# Patient Record
Sex: Male | Born: 1980 | Race: Black or African American | Hispanic: No | Marital: Single | Smoking: Current some day smoker
Health system: Southern US, Community
[De-identification: ages and names within clinical notes are randomized; demographics above are authoritative.]

## PROBLEM LIST (undated history)

## (undated) DIAGNOSIS — I10 Essential (primary) hypertension: Secondary | ICD-10-CM

## (undated) DIAGNOSIS — E785 Hyperlipidemia, unspecified: Secondary | ICD-10-CM

---

## 2020-08-07 ENCOUNTER — Other Ambulatory Visit: Payer: Self-pay

## 2020-08-07 ENCOUNTER — Emergency Department (HOSPITAL_COMMUNITY)
Admission: EM | Admit: 2020-08-07 | Discharge: 2020-08-08 | Disposition: A | Discharge status: Home / Self Care | Payer: 59 | Attending: Emergency Medicine | Admitting: Emergency Medicine

## 2020-08-07 ENCOUNTER — Encounter (HOSPITAL_COMMUNITY): Payer: Self-pay | Admitting: Emergency Medicine

## 2020-08-07 DIAGNOSIS — R748 Abnormal levels of other serum enzymes: Secondary | ICD-10-CM | POA: Diagnosis not present

## 2020-08-07 DIAGNOSIS — R1011 Right upper quadrant pain: Secondary | ICD-10-CM | POA: Insufficient documentation

## 2020-08-07 DIAGNOSIS — I10 Essential (primary) hypertension: Secondary | ICD-10-CM | POA: Insufficient documentation

## 2020-08-07 DIAGNOSIS — R1010 Upper abdominal pain, unspecified: Secondary | ICD-10-CM

## 2020-08-07 HISTORY — DX: Hyperlipidemia, unspecified: E78.5

## 2020-08-07 HISTORY — DX: Essential (primary) hypertension: I10

## 2020-08-07 NOTE — ED Triage Notes (Signed)
Pt reports cramping under right ribs that radiates to right side x 2 weeks, worsening over last week.  Denies fevers, N/V, changes in Bms.

## 2020-08-08 ENCOUNTER — Emergency Department (HOSPITAL_COMMUNITY): Payer: 59

## 2020-08-08 ENCOUNTER — Encounter (HOSPITAL_COMMUNITY): Payer: Self-pay

## 2020-08-08 LAB — URINALYSIS, ROUTINE W REFLEX MICROSCOPIC
Bilirubin Urine: NEGATIVE
Glucose, UA: NEGATIVE mg/dL
Hgb urine dipstick: NEGATIVE
Ketones, ur: 5 mg/dL — AB
Leukocytes,Ua: NEGATIVE
Nitrite: NEGATIVE
Protein, ur: NEGATIVE mg/dL
Specific Gravity, Urine: 1.025 (ref 1.005–1.030)
pH: 5 (ref 5.0–8.0)

## 2020-08-08 LAB — CBC WITH DIFFERENTIAL/PLATELET
Abs Immature Granulocytes: 0.03 10*3/uL (ref 0.00–0.07)
Basophils Absolute: 0 10*3/uL (ref 0.0–0.1)
Basophils Relative: 0 %
Eosinophils Absolute: 0.2 10*3/uL (ref 0.0–0.5)
Eosinophils Relative: 2 %
HCT: 41.5 % (ref 39.0–52.0)
Hemoglobin: 13.4 g/dL (ref 13.0–17.0)
Immature Granulocytes: 0 %
Lymphocytes Relative: 48 %
Lymphs Abs: 4.4 10*3/uL — ABNORMAL HIGH (ref 0.7–4.0)
MCH: 29.3 pg (ref 26.0–34.0)
MCHC: 32.3 g/dL (ref 30.0–36.0)
MCV: 90.8 fL (ref 80.0–100.0)
Monocytes Absolute: 0.7 10*3/uL (ref 0.1–1.0)
Monocytes Relative: 8 %
Neutro Abs: 3.9 10*3/uL (ref 1.7–7.7)
Neutrophils Relative %: 42 %
Platelets: 281 10*3/uL (ref 150–400)
RBC: 4.57 MIL/uL (ref 4.22–5.81)
RDW: 14 % (ref 11.5–15.5)
WBC: 9.3 10*3/uL (ref 4.0–10.5)
nRBC: 0 % (ref 0.0–0.2)

## 2020-08-08 LAB — COMPREHENSIVE METABOLIC PANEL
ALT: 21 U/L (ref 0–44)
AST: 33 U/L (ref 15–41)
Albumin: 3.8 g/dL (ref 3.5–5.0)
Alkaline Phosphatase: 73 U/L (ref 38–126)
Anion gap: 8 (ref 5–15)
BUN: 17 mg/dL (ref 6–20)
CO2: 25 mmol/L (ref 22–32)
Calcium: 9.3 mg/dL (ref 8.9–10.3)
Chloride: 105 mmol/L (ref 98–111)
Creatinine, Ser: 0.93 mg/dL (ref 0.61–1.24)
GFR, Estimated: >60 mL/min (ref >60)
Glucose, Bld: 155 mg/dL — ABNORMAL HIGH (ref 70–99)
Potassium: 4.2 mmol/L (ref 3.5–5.1)
Sodium: 138 mmol/L (ref 135–145)
Total Bilirubin: 0.2 mg/dL — ABNORMAL LOW (ref 0.3–1.2)
Total Protein: 7.1 g/dL (ref 6.5–8.1)

## 2020-08-08 LAB — LIPASE, BLOOD: Lipase: 176 U/L — ABNORMAL HIGH (ref 11–51)

## 2020-08-08 MED ORDER — HYDROCODONE-ACETAMINOPHEN 5-325 MG PO TABS: 1.0000 | ORAL_TABLET | ORAL | 0 refills | Status: DC | PRN

## 2020-08-08 MED ORDER — IOHEXOL 350 MG/ML SOLN
80.0000 mL | Freq: Once | INTRAVENOUS | Status: AC | PRN
  Administered 2020-08-08: 80 mL via INTRAVENOUS

## 2020-08-08 MED ORDER — OXYCODONE-ACETAMINOPHEN 5-325 MG PO TABS
1.0000 | ORAL_TABLET | Freq: Once | ORAL | Status: AC
  Administered 2020-08-08: 1 via ORAL
  Filled 2020-08-08: qty 1

## 2020-08-08 NOTE — Discharge Instructions (Addendum)
Follow up with primary care for further outpatient evaluation of abdominal pain. Return to the ED with any high fever, severe pain, bloody stools, excessive vomiting or other new concern.

## 2020-08-08 NOTE — ED Notes (Signed)
Patient transported to CT 

## 2020-08-08 NOTE — ED Provider Notes (Signed)
[facility]-EMERGENCY DEPT Provider Note   CSN: [id number] Arrival date & time: 08/07/20  2325     History Chief Complaint  Patient presents with   Abdominal Pain    Shaune Westfall is a 40 y.o. male.  Patient to ED with RUQ abdominal pain for the past 6 weeks that has been intermittent until 6 days ago when it became more constant. No nausea, vomiting. He denies modifying factors, including no worse with eating. No fever. He has had infrequent loose stool that are nonbloody. No cough or SOB. The pain will radiate down the right side of the abdomen but no where else. No urinary symptoms.   The history is provided by the patient. No language interpreter was used.  Abdominal Pain Associated symptoms: no chest pain, no chills, no constipation, no cough, no fever, no nausea, no shortness of breath and no vomiting       Past Medical History:  Diagnosis Date   Hyperlipidemia    Hypertension     There are no problems to display for this patient.   History reviewed. No pertinent surgical history.     No family history on file.     Home Medications Prior to Admission medications   Not on File    Allergies    Patient has no known allergies.  Review of Systems   Review of Systems  Constitutional:  Negative for chills and fever.  HENT: Negative.    Respiratory: Negative.  Negative for cough and shortness of breath.   Cardiovascular: Negative.  Negative for chest pain.  Gastrointestinal:  Positive for abdominal pain. Negative for blood in stool, constipation, nausea and vomiting.  Musculoskeletal: Negative.   Skin: Negative.   Neurological: Negative.    Physical Exam Updated Vital Signs BP (!) 145/93 (BP Location: Left Arm)   Pulse 70   Temp 97.8 F (36.6 C) (Oral)   Resp 12   Ht 5\' 6"  (1.676 m)   Wt 86.2 kg   SpO2 99%   BMI 30.67 kg/m   Physical Exam Vitals and nursing note reviewed.  Constitutional:      Appearance: He is  well-developed.  HENT:     Head: Normocephalic.  Cardiovascular:     Rate and Rhythm: Normal rate and regular rhythm.     Heart sounds: No murmur heard. Pulmonary:     Effort: Pulmonary effort is normal.     Breath sounds: Normal breath sounds. No wheezing, rhonchi or rales.  Abdominal:     General: Abdomen is protuberant. Bowel sounds are normal.     Palpations: Abdomen is soft.     Tenderness: There is abdominal tenderness in the right upper quadrant. There is no guarding or rebound.  Musculoskeletal:        General: Normal range of motion.     Cervical back: Normal range of motion and neck supple.  Skin:    General: Skin is warm and dry.  Neurological:     General: No focal deficit present.     Mental Status: He is alert and oriented to person, place, and time.    ED Results / Procedures / Treatments   Labs (all labs ordered are listed, but only abnormal results are displayed) Labs Reviewed  LIPASE, BLOOD - Abnormal; Notable for the following components:      Result Value   Lipase 176 (*)    All other components within normal limits  COMPREHENSIVE METABOLIC PANEL - Abnormal; Notable for the following components:  Glucose, Bld 155 (*)    Total Bilirubin 0.2 (*)    All other components within normal limits  URINALYSIS, ROUTINE W REFLEX MICROSCOPIC - Abnormal; Notable for the following components:   APPearance HAZY (*)    Ketones, ur 5 (*)    All other components within normal limits  CBC WITH DIFFERENTIAL/PLATELET - Abnormal; Notable for the following components:   Lymphs Abs 4.4 (*)    All other components within normal limits    EKG None  Radiology No results found.  Procedures Procedures   Medications Ordered in ED Medications - No data to display  ED Course  I have reviewed the triage vital signs and the nursing notes.  Pertinent labs & imaging results that were available during my care of the patient were reviewed by me and considered in my medical  decision making (see chart for details).    MDM Rules/Calculators/A&P                           Patient to ED with complaint of abdominal pain as described in the HPI. No fever. No N, V.   There is right UQ tenderness to distended abdomen. No previous surgeries. No fever. Lipase elevated to 176. RUQ [place] ordered and is negative. On re-exam, the patient is still very tender, now across upper abdomen. Will obtain CT.   CT abd/pel unremarkable. Patient and family updated. No serious condition identified. He can be discharged home with referral to primary care.   Final Clinical Impression(s) / ED Diagnoses Final diagnoses:  None   Abdominal pain Elevated lipase  Rx / DC Orders ED Discharge Orders     None        Elpidio Anis, PA-C 08/08/20 9470    Gilda Crease, MD 08/08/20 [phone]

## 2022-05-19 IMAGING — CT CT ABD-PELV W/ CM
2 of 5 series · 16 of 46 positions shown, 18 images · IV contrast (omnipaque)
Comparison: None.

CLINICAL DATA: Abdominal abscess or infection suspected. Cramping
under the right ribs for 2 weeks

EXAM:
CT ABDOMEN AND PELVIS WITH CONTRAST
TECHNIQUE: Multidetector CT imaging of the abdomen and pelvis was performed
using the standard protocol following bolus administration of
intravenous contrast.
CONTRAST:  80mL OMNIPAQUE IOHEXOL 350 MG/ML SOLN

[Series 2: axial st · axial · 0.82mm/px · z∈[-504,-124]mm · 13 of 90 slices shown, 15 images]
[im 7/90  soft-tissue]
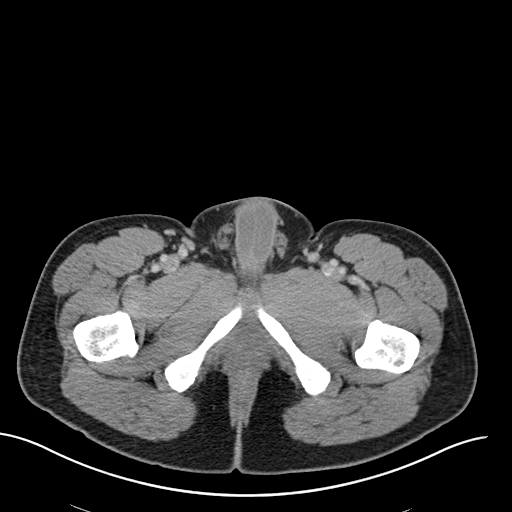
[im 7/90  bone]
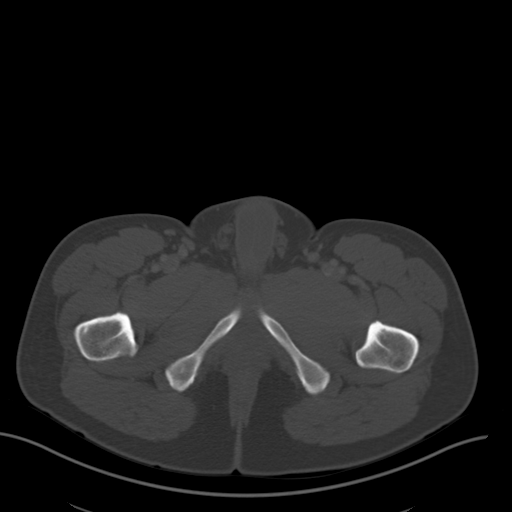
[im 13/90  soft-tissue]
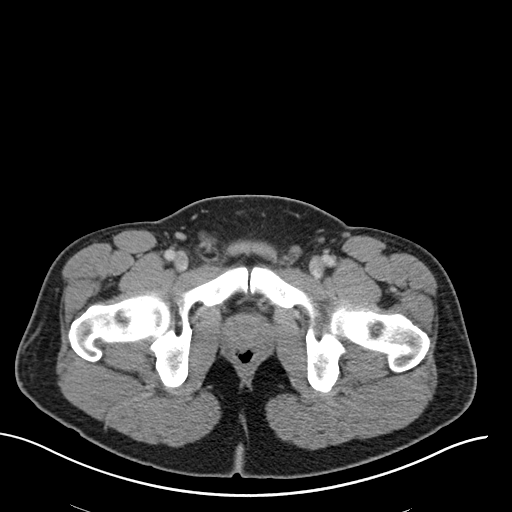
[im 20/90  soft-tissue]
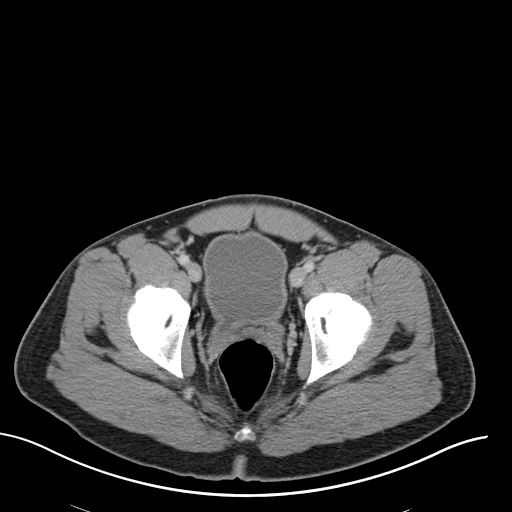
[im 26/90  soft-tissue]
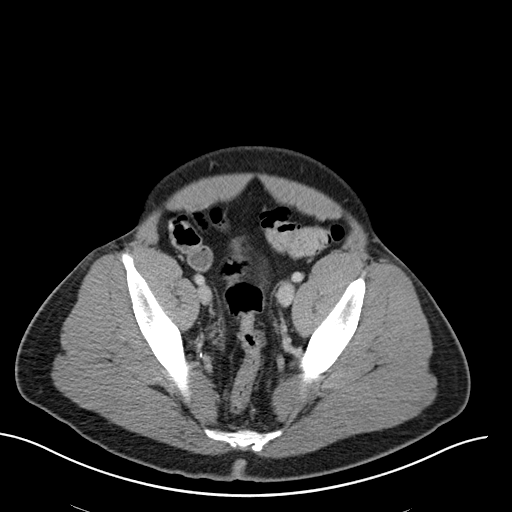
[im 32/90  soft-tissue]
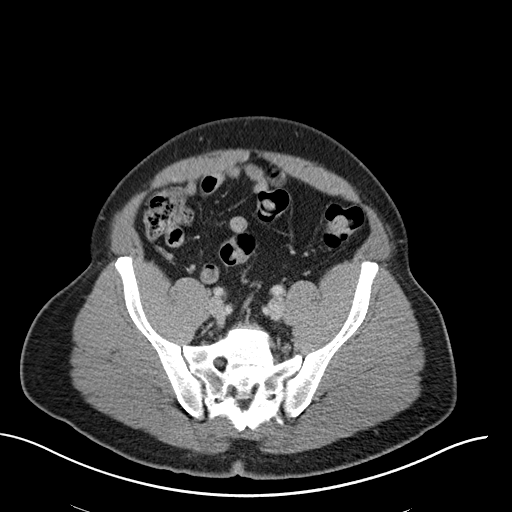
[im 39/90  soft-tissue]
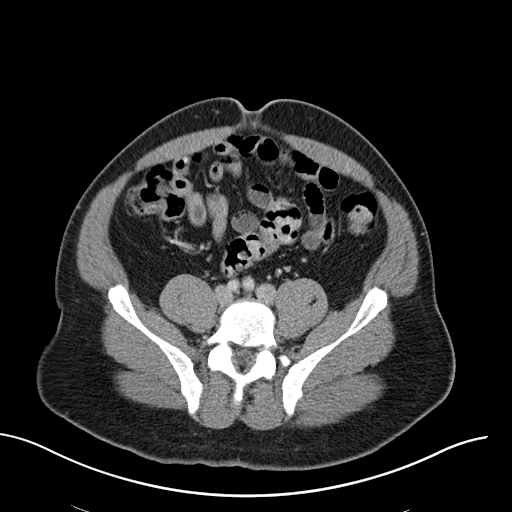
[im 45/90  soft-tissue]
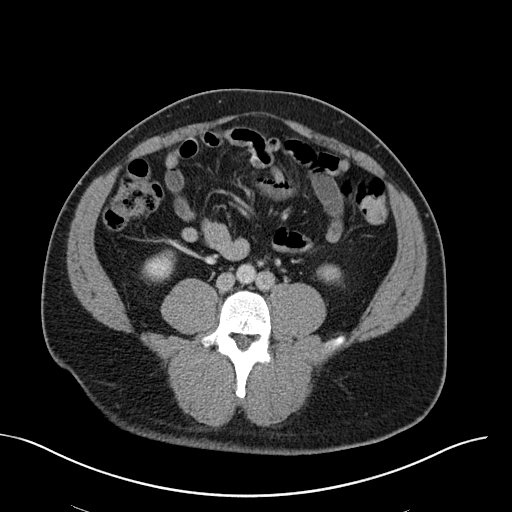
[im 51/90  soft-tissue]
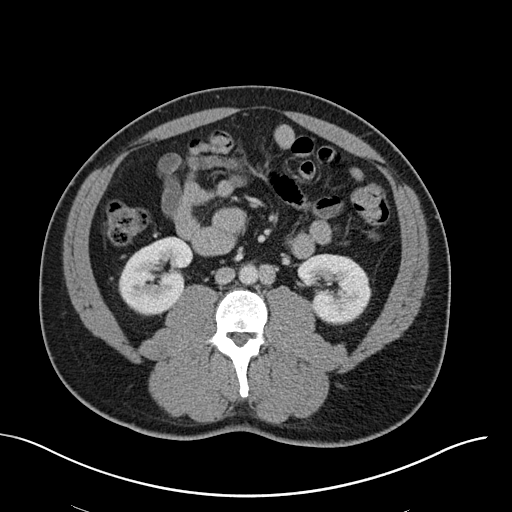
[im 58/90  soft-tissue]
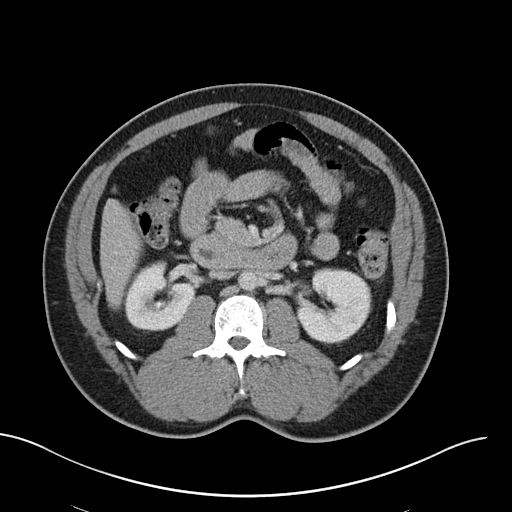
[im 58/90  bone]
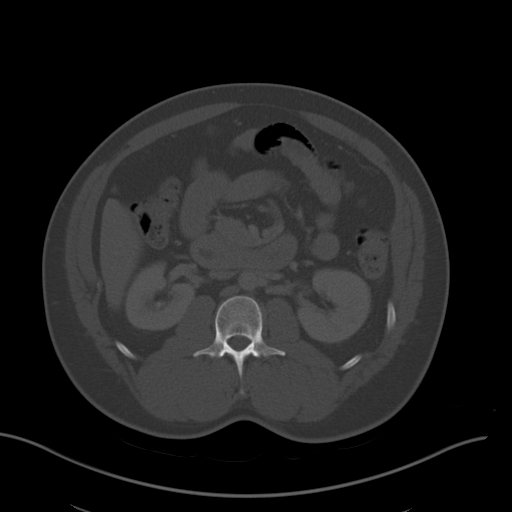
[im 64/90  soft-tissue]
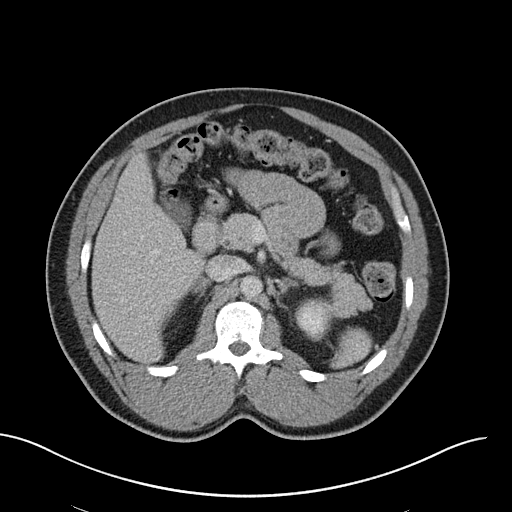
[im 70/90  soft-tissue]
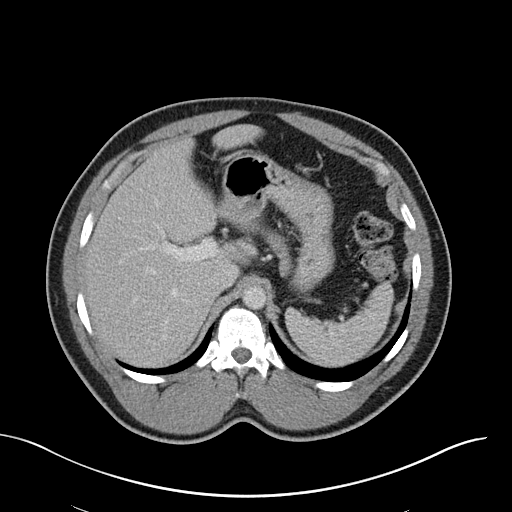
[im 77/90  soft-tissue]
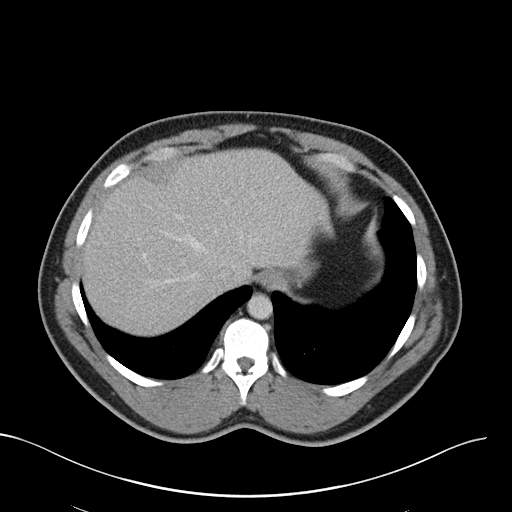
[im 83/90  soft-tissue]
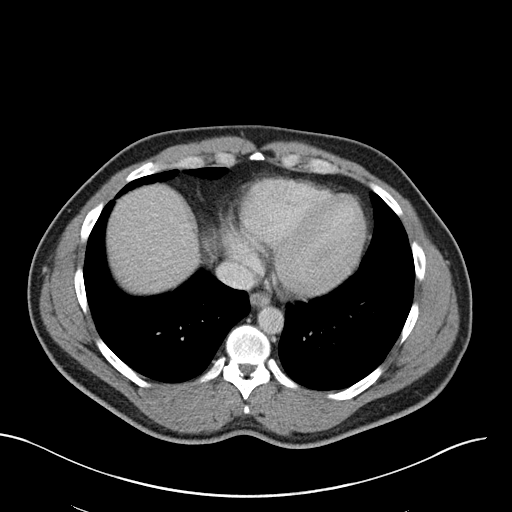

[Series 5: coronal st · coronal · 0.77mm/px · 3 of 171 slices shown]
[im 57/171  soft-tissue]
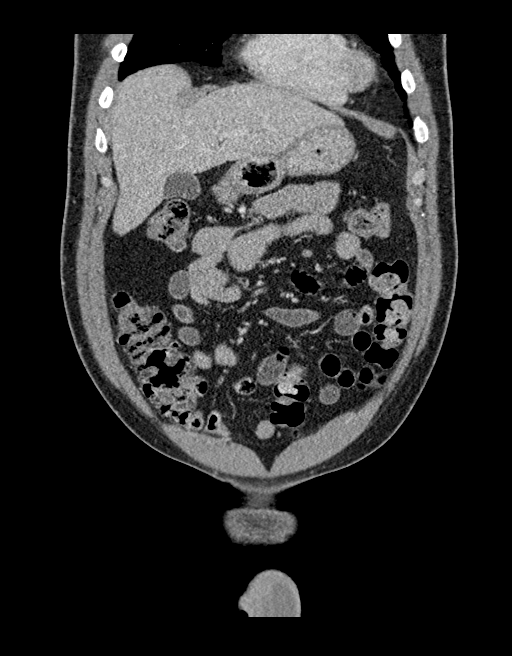
[im 76/171  soft-tissue]
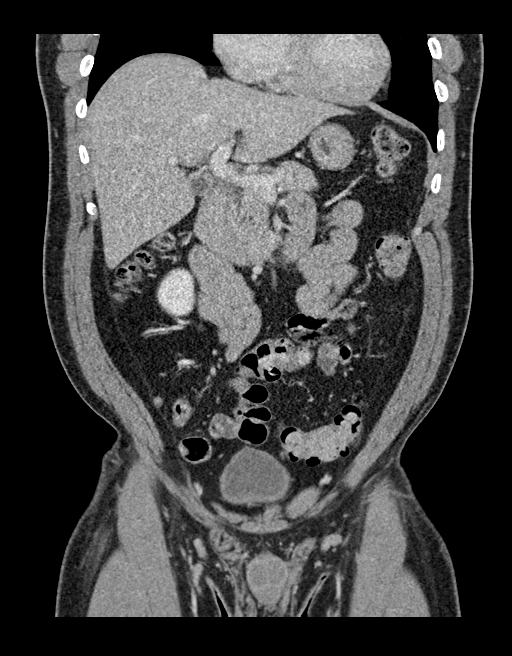
[im 95/171  soft-tissue]
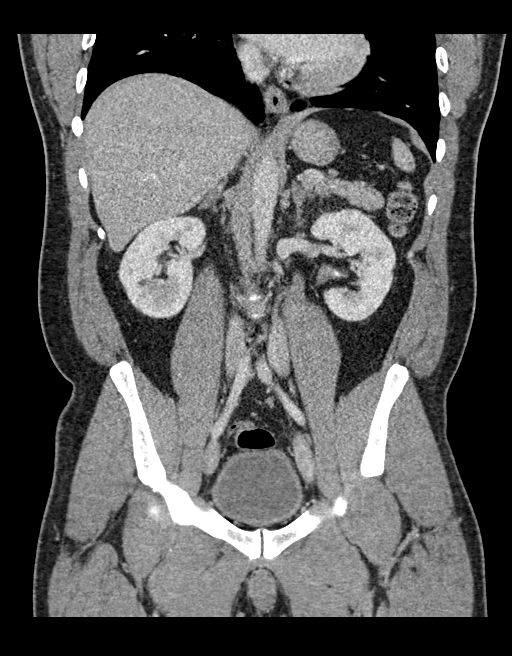

[16 of 46 positions shown; findings below may reference images not displayed]

FINDINGS: Lower chest:  No contributory findings.

Hepatobiliary: No focal liver abnormality.No evidence of biliary
obstruction or stone.

Pancreas: Unremarkable.

Spleen: Unremarkable.

Adrenals/Urinary Tract: Negative adrenals. No hydronephrosis or
ureteral stone. Limited for detecting small renal calculi given
early contrast excretion. Diverticulum without filling defect
extending posteriorly and laterally from the bladder base, near the
ureteral orifice.

Stomach/Bowel:  No obstruction. No appendicitis.

Vascular/Lymphatic: No acute vascular abnormality. Duplicated IVC.
No mass or adenopathy.

Reproductive:No pathologic findings.

Other: No ascites or pneumoperitoneum.

Musculoskeletal: No acute abnormalities. AVN of the right femoral
head without collapse.
IMPRESSION: 1. No acute finding.
2. Right-sided periureteric bladder diverticulum, possibly
congenital.
3. Avascular necrosis of the right femoral head.

## 2023-04-15 ENCOUNTER — Ambulatory Visit (HOSPITAL_COMMUNITY): Admission: EM | Admit: 2023-04-15 | Discharge: 2023-04-15 | Disposition: A | Discharge status: Home / Self Care

## 2023-04-15 ENCOUNTER — Ambulatory Visit (INDEPENDENT_AMBULATORY_CARE_PROVIDER_SITE_OTHER)

## 2023-04-15 ENCOUNTER — Encounter (HOSPITAL_COMMUNITY): Payer: Self-pay

## 2023-04-15 DIAGNOSIS — R6884 Jaw pain: Secondary | ICD-10-CM

## 2023-04-15 DIAGNOSIS — M7989 Other specified soft tissue disorders: Secondary | ICD-10-CM | POA: Diagnosis not present

## 2023-04-15 DIAGNOSIS — M79641 Pain in right hand: Secondary | ICD-10-CM

## 2023-04-15 MED ORDER — IBUPROFEN 800 MG PO TABS: 800.0000 mg | ORAL_TABLET | Freq: Three times a day (TID) | ORAL | 0 refills | Status: AC

## 2023-04-15 MED ORDER — IBUPROFEN 800 MG PO TABS
800.0000 mg | ORAL_TABLET | Freq: Once | ORAL | Status: AC
  Administered 2023-04-15: 800 mg via ORAL

## 2023-04-15 MED ORDER — IBUPROFEN 800 MG PO TABS
ORAL_TABLET | ORAL | Status: AC
  Filled 2023-04-15: qty 1

## 2023-04-15 NOTE — Discharge Instructions (Addendum)
 1. Pain in lower jaw (Primary) - DG Mandible 4 Views shows no acute fracture to the jaw or other facial bones visualized. - ibuprofen (ADVIL) tablet 800 mg given in UC for acute pain to the left jaw, right hand, and left finger. - ibuprofen (ADVIL) 800 MG tablet; Take 1 tablet (800 mg total) by mouth 3 (three) times daily.  Dispense: 30 tablet; Refill: 0  2. Right hand pain - DG Hand Complete Right x-ray performed in UC shows no acute fracture or dislocation to the right hand  3. Swelling of left middle finger - DG Finger Middle Left x-ray performed in UC shows no acute fracture or dislocation of left middle finger.
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
## 2023-06-18 ENCOUNTER — Ambulatory Visit: Admitting: Nurse Practitioner

## 2023-10-20 ENCOUNTER — Encounter (HOSPITAL_COMMUNITY): Payer: Self-pay

## 2023-10-20 ENCOUNTER — Emergency Department (HOSPITAL_COMMUNITY)
Admission: EM | Admit: 2023-10-20 | Discharge: 2023-10-20 | Disposition: A | Discharge status: Home / Self Care | Attending: Emergency Medicine | Admitting: Emergency Medicine

## 2023-10-20 ENCOUNTER — Emergency Department (HOSPITAL_COMMUNITY)

## 2023-10-20 ENCOUNTER — Other Ambulatory Visit: Payer: Self-pay

## 2023-10-20 DIAGNOSIS — Z79899 Other long term (current) drug therapy: Secondary | ICD-10-CM | POA: Insufficient documentation

## 2023-10-20 DIAGNOSIS — I10 Essential (primary) hypertension: Secondary | ICD-10-CM | POA: Insufficient documentation

## 2023-10-20 LAB — COMPREHENSIVE METABOLIC PANEL WITH GFR
ALT: 12 U/L (ref 0–44)
AST: 18 U/L (ref 15–41)
Albumin: 3.8 g/dL (ref 3.5–5.0)
Alkaline Phosphatase: 55 U/L (ref 38–126)
Anion gap: 9 (ref 5–15)
BUN: 14 mg/dL (ref 6–20)
CO2: 26 mmol/L (ref 22–32)
Calcium: 9.2 mg/dL (ref 8.9–10.3)
Chloride: 105 mmol/L (ref 98–111)
Creatinine, Ser: 1.11 mg/dL (ref 0.61–1.24)
GFR, Estimated: >60 mL/min (ref >60)
Glucose, Bld: 110 mg/dL — ABNORMAL HIGH (ref 70–99)
Potassium: 4.4 mmol/L (ref 3.5–5.1)
Sodium: 140 mmol/L (ref 135–145)
Total Bilirubin: 0.6 mg/dL (ref 0.0–1.2)
Total Protein: 6.9 g/dL (ref 6.5–8.1)

## 2023-10-20 LAB — CBC WITH DIFFERENTIAL/PLATELET
Abs Immature Granulocytes: 0.01 K/uL (ref 0.00–0.07)
Basophils Absolute: 0 K/uL (ref 0.0–0.1)
Basophils Relative: 0 %
Eosinophils Absolute: 0.1 K/uL (ref 0.0–0.5)
Eosinophils Relative: 1 %
HCT: 42.6 % (ref 39.0–52.0)
Hemoglobin: 13.9 g/dL (ref 13.0–17.0)
Immature Granulocytes: 0 %
Lymphocytes Relative: 45 %
Lymphs Abs: 3.7 K/uL (ref 0.7–4.0)
MCH: 28.8 pg (ref 26.0–34.0)
MCHC: 32.6 g/dL (ref 30.0–36.0)
MCV: 88.2 fL (ref 80.0–100.0)
Monocytes Absolute: 0.5 K/uL (ref 0.1–1.0)
Monocytes Relative: 6 %
Neutro Abs: 3.9 K/uL (ref 1.7–7.7)
Neutrophils Relative %: 48 %
Platelets: 304 K/uL (ref 150–400)
RBC: 4.83 MIL/uL (ref 4.22–5.81)
RDW: 14.6 % (ref 11.5–15.5)
WBC: 8.1 K/uL (ref 4.0–10.5)
nRBC: 0 % (ref 0.0–0.2)

## 2023-10-20 MED ORDER — AMLODIPINE BESYLATE 2.5 MG PO TABS: 2.5000 mg | ORAL_TABLET | Freq: Every day | ORAL | 3 refills | Status: AC

## 2023-10-20 NOTE — ED Triage Notes (Signed)
 Pt states he needs labs and EKG drawn for his upcoming surgery on his knee.

## 2023-10-20 NOTE — ED Notes (Signed)
 Pt left for home in no new onset distress. Paper work reviewed with pt.

## 2023-10-20 NOTE — ED Provider Notes (Signed)
 [place] EMERGENCY DEPARTMENT AT [facility] Provider Note   CSN: [id number] Arrival date & time: 10/20/23  1105     Patient presents with: Labs Only   Philip Rosales is a 43 y.o. male.   Patient is a 43 year old male with a history of hypertension and hyperlipidemia but reports was taken off blood pressure medication approximately a year ago who is presenting today requesting surgical clearance.  He reports for the last 2 months he has been trying to get scheduled for surgery for his knee but he does not have a PCP and needs surgical clearance.  He reports that he still uses cigarettes approximately 4 cigarettes a day and smokes marijuana.  He does not use alcohol regularly and denies any cocaine or other drug use.  He has a family history of his dad with cardiac problems which he is not sure what but the rest of his family members do not have heart disease.  He does report that intermittently he will check his blood pressure and sometimes it is elevated but he has been dealing with a lot of pain in his knee and thinks that is why his blood pressure is elevated today.  He denies any chest pain or shortness of breath.  Has no history of asthma or needing an inhaler as an adult.  The history is provided by the patient.       Prior to Admission medications   Medication Sig Start Date End Date Taking? Authorizing Provider  amLODipine (NORVASC) 2.5 MG tablet Take 1 tablet (2.5 mg total) by mouth daily. 10/20/23  Yes Chrysta Fulcher, [place], MD  ibuprofen  (ADVIL ) 800 MG tablet Take 1 tablet (800 mg total) by mouth 3 (three) times daily. 04/15/23   Reddick, Johnathan B, NP    Allergies: Coconut oil    Review of Systems  Updated Vital Signs BP (!) 156/108   Pulse (!) 52   Temp 98.5 F (36.9 C)   Resp 16   Ht 5' 6 (1.676 m)   Wt 86.2 kg   SpO2 96%   BMI 30.67 kg/m   Physical Exam Vitals and nursing note reviewed.  Constitutional:      General: He is not in acute  distress.    Appearance: He is well-developed.  HENT:     Head: Normocephalic and atraumatic.  Eyes:     Conjunctiva/sclera: Conjunctivae normal.     Pupils: Pupils are equal, round, and reactive to light.  Cardiovascular:     Rate and Rhythm: Normal rate and regular rhythm.     Pulses: Normal pulses.     Heart sounds: No murmur heard. Pulmonary:     Effort: Pulmonary effort is normal. No respiratory distress.     Breath sounds: Normal breath sounds. No wheezing or rales.  Abdominal:     General: There is no distension.     Palpations: Abdomen is soft.     Tenderness: There is no abdominal tenderness. There is no guarding or rebound.  Musculoskeletal:     Cervical back: Normal range of motion and neck supple.  Skin:    General: Skin is warm and dry.     Findings: No erythema or rash.  Neurological:     Mental Status: He is alert and oriented to person, place, and time.  Psychiatric:        Behavior: Behavior normal.     (all labs ordered are listed, but only abnormal results are displayed) Labs Reviewed  COMPREHENSIVE METABOLIC PANEL WITH  GFR - Abnormal; Notable for the following components:      Result Value   Glucose, Bld 110 (*)    All other components within normal limits  CBC WITH DIFFERENTIAL/PLATELET    EKG: EKG Interpretation Date/Time:  Saturday October 20 2023 12:14:22 EDT Ventricular Rate:  50 PR Interval:  196 QRS Duration:  88 QT Interval:  410 QTC Calculation: 373 R Axis:   48  Text Interpretation: Sinus bradycardia with sinus arrhythmia Otherwise normal ECG No previous ECGs available Confirmed by Doretha Folks ([id number]) on 10/20/2023 1:02:57 PM  Radiology: [facility] Chest 2 View Result Date: 10/20/2023 CLINICAL DATA:  Surgical clearance. EXAM: CHEST - 2 VIEW COMPARISON:  None Available. FINDINGS: The heart size and mediastinal contours are within normal limits. Both lungs are clear. The visualized skeletal structures are unremarkable. IMPRESSION: No  active cardiopulmonary disease. Electronically Signed   By: Elspeth Bathe M.D.   On: 10/20/2023 12:50     Procedures   Medications Ordered in the ED - No data to display                                  Medical Decision Making Amount and/or Complexity of Data Reviewed Labs: ordered. Decision-making details documented in ED Course. Radiology: ordered and independent interpretation performed. Decision-making details documented in ED Course. ECG/medicine tests: ordered and independent interpretation performed. Decision-making details documented in ED Course.   Patient presenting here today requesting medical clearance with labs, EKG so that he can get his knee surgery.  He has no specific complaints at this time.  Patient was noted to have a blood pressure of 162/87 and will recheck. I independently interpreted patient's labs and EKG.  EKG is normal, labs within normal CBC and CMP.  Patient is persistently hypertensive here and reports he has been hypertensive at home.  Will start him on a low-dose of amlodipine.  I have independently visualized and interpreted pt's images today.  Chest x-ray within normal limits.      Final diagnoses:  Uncontrolled hypertension    ED Discharge Orders          Ordered    amLODipine (NORVASC) 2.5 MG tablet  Daily        10/20/23 1336               Doretha Folks, MD 10/20/23 1336

## 2023-10-20 NOTE — Discharge Instructions (Signed)
 You were given a prescription for blood pressure medication because your blood pressure has been persistently elevated.  All your labs, chest x-ray and EKG were normal today.

## 2023-11-20 ENCOUNTER — Emergency Department (HOSPITAL_COMMUNITY): Payer: Self-pay

## 2023-11-20 ENCOUNTER — Emergency Department (HOSPITAL_COMMUNITY)
Admission: EM | Admit: 2023-11-20 | Discharge: 2023-11-20 | Disposition: A | Discharge status: Home / Self Care | Payer: Self-pay | Attending: Emergency Medicine | Admitting: Emergency Medicine

## 2023-11-20 ENCOUNTER — Encounter (HOSPITAL_COMMUNITY): Payer: Self-pay

## 2023-11-20 ENCOUNTER — Other Ambulatory Visit: Payer: Self-pay

## 2023-11-20 DIAGNOSIS — G8918 Other acute postprocedural pain: Secondary | ICD-10-CM | POA: Insufficient documentation

## 2023-11-20 DIAGNOSIS — R0789 Other chest pain: Secondary | ICD-10-CM | POA: Insufficient documentation

## 2023-11-20 DIAGNOSIS — D72829 Elevated white blood cell count, unspecified: Secondary | ICD-10-CM | POA: Insufficient documentation

## 2023-11-20 DIAGNOSIS — R059 Cough, unspecified: Secondary | ICD-10-CM | POA: Insufficient documentation

## 2023-11-20 DIAGNOSIS — R058 Other specified cough: Secondary | ICD-10-CM

## 2023-11-20 DIAGNOSIS — R0981 Nasal congestion: Secondary | ICD-10-CM | POA: Insufficient documentation

## 2023-11-20 LAB — BASIC METABOLIC PANEL WITH GFR
Anion gap: 9 (ref 5–15)
BUN: 11 mg/dL (ref 6–20)
CO2: 23 mmol/L (ref 22–32)
Calcium: 8.9 mg/dL (ref 8.9–10.3)
Chloride: 105 mmol/L (ref 98–111)
Creatinine, Ser: 0.92 mg/dL (ref 0.61–1.24)
GFR, Estimated: >60 mL/min (ref >60)
Glucose, Bld: 110 mg/dL — ABNORMAL HIGH (ref 70–99)
Potassium: 3.9 mmol/L (ref 3.5–5.1)
Sodium: 137 mmol/L (ref 135–145)

## 2023-11-20 LAB — CBC
HCT: 44.3 % (ref 39.0–52.0)
Hemoglobin: 13.9 g/dL (ref 13.0–17.0)
MCH: 28 pg (ref 26.0–34.0)
MCHC: 31.4 g/dL (ref 30.0–36.0)
MCV: 89.1 fL (ref 80.0–100.0)
Platelets: 322 K/uL (ref 150–400)
RBC: 4.97 MIL/uL (ref 4.22–5.81)
RDW: 14.3 % (ref 11.5–15.5)
WBC: 11.2 K/uL — ABNORMAL HIGH (ref 4.0–10.5)
nRBC: 0 % (ref 0.0–0.2)

## 2023-11-20 LAB — TROPONIN I (HIGH SENSITIVITY)
Troponin I (High Sensitivity): 5 ng/L (ref <18)
Troponin I (High Sensitivity): 4 ng/L (ref <18)

## 2023-11-20 LAB — RESP PANEL BY RT-PCR (RSV, FLU A&B, COVID)  RVPGX2
Influenza A by PCR: NEGATIVE
Influenza B by PCR: NEGATIVE
Resp Syncytial Virus by PCR: NEGATIVE
SARS Coronavirus 2 by RT PCR: NEGATIVE

## 2023-11-20 LAB — D-DIMER, QUANTITATIVE: D-Dimer, Quant: 0.3 ug{FEU}/mL (ref 0.00–0.50)

## 2023-11-20 NOTE — Discharge Instructions (Signed)
 Today for chest wall pain after surgery as well as for cough and congestion.  Your lab work today was very reassuring that low suspicion for any emergent cause of your symptoms today.  Have also recommended continued follow-up with orthopedics as well as with your PCP.  You can additionally use Tylenol  and ibuprofen  as needed for pain control.  Take Tylenol  (acetominophen)  650mg  every 4-6 hours, as needed for pain or fever. Do not take more than 4,000 mg in a 24-hour period. As this may cause liver damage. While this is rare, if you begin to develop yellowing of the skin or eyes, stop taking and return to ER immediately.  Take Ibuprofen  400mg  every 4-6 hours for pain or fever, not exceeding 3,200 mg per day as more than 3,200mg  can cause Stomach irritation, dizziness, kidney issues with long-term use.  Return if you been having new or worsening symptoms or to include fever, uncontrollable shortness of breath on exertion, coughing up blood, blood in urine or stool, fainting.

## 2023-11-20 NOTE — ED Triage Notes (Signed)
 Pt reports chest pain since last Wednesday. Pt reports knee surgery on Tuesday and reports shortness of breath as well. Pt reports the pain is worse with breathing.

## 2023-11-20 NOTE — ED Provider Notes (Signed)
 [facility] [facility] AT [facility] Provider Note   CSN: [id number] Arrival date & time: 11/20/23  0630     Patient presents with: Chest Pain   Philip Rosales is a 43 y.o. male. The history is provided by the patient. The history is limited by a language barrier. A language interpreter was used.  Chest Pain Associated symptoms: cough and shortness of breath   Patient is a 43 year old male present ED today for concerns for progressively worse pleuritic chest pain, right sided radiating to central sternal region that has been ongoing x 1 week accompanied with shortness of breath.  Reported that he also does have some right sided chest wall tenderness.  No exertional symptoms.  Recently underwent right knee surgery in [place]  1 week ago.  With follow-up with orthopedics scheduled for tomorrow.  Additionally endorsing cough and congestion for same period of time.  Patient with history of HTN, HLD.  Denies fever, headache, blurry vision, vertigo, dysphagia, odynophagia, unilateral weakness, hemoptysis, abdominal pain, nausea, vomiting, diarrhea, dysuria, hematuria, numbness, weakness, tingling     Prior to Admission medications   Medication Sig Start Date End Date Taking? Authorizing Provider  amLODipine (NORVASC) 2.5 MG tablet Take 1 tablet (2.5 mg total) by mouth daily. 10/20/23   Doretha Folks, MD  ibuprofen  (ADVIL ) 800 MG tablet Take 1 tablet (800 mg total) by mouth 3 (three) times daily. 04/15/23   Reddick, Johnathan B, NP    Allergies: Coconut oil    Review of Systems  HENT:  Positive for congestion.   Respiratory:  Positive for cough and shortness of breath.   Cardiovascular:  Positive for chest pain.  All other systems reviewed and are negative.   Updated Vital Signs BP (!) 144/106   Pulse (!) 50   Temp 98.3 F (36.8 C) (Oral)   Resp 15   Wt 86.2 kg   SpO2 100%   BMI 30.67 kg/m   Physical Exam Vitals and nursing note reviewed.   Constitutional:      General: He is not in acute distress.    Appearance: Normal appearance. He is not ill-appearing or diaphoretic.  HENT:     Head: Normocephalic and atraumatic.     Nose: Congestion present. No rhinorrhea.     Mouth/Throat:     Mouth: Mucous membranes are moist.     Pharynx: Oropharynx is clear. No oropharyngeal exudate.  Eyes:     General: No scleral icterus.       Right eye: No discharge.        Left eye: No discharge.     Extraocular Movements: Extraocular movements intact.     Conjunctiva/sclera: Conjunctivae normal.     Pupils: Pupils are equal, round, and reactive to light.  Cardiovascular:     Rate and Rhythm: Normal rate and regular rhythm.     Pulses: Normal pulses.     Heart sounds: Normal heart sounds. No murmur heard.    No friction rub. No gallop.  Pulmonary:     Effort: Pulmonary effort is normal. No respiratory distress.     Breath sounds: No stridor. No wheezing, rhonchi or rales.  Chest:     Chest wall: Tenderness (Member does have some right wall chest tenderness to palpation with no obvious bruising or ecchymosis.) present.  Abdominal:     General: Abdomen is flat. There is no distension.     Palpations: Abdomen is soft.     Tenderness: There is no abdominal tenderness. There is no  right CVA tenderness, left CVA tenderness, guarding or rebound.  Musculoskeletal:        General: No swelling, deformity or signs of injury.     Cervical back: Normal range of motion. No rigidity.     Right lower leg: No edema.     Left lower leg: No edema.     Comments: Notably has well-healing surgical scar to right knee, sutures still in place.  No signs of erythema, discharge, with no pain to palpation over patella  Skin:    General: Skin is warm and dry.     Findings: No bruising, erythema or lesion.  Neurological:     General: No focal deficit present.     Mental Status: He is alert and oriented to person, place, and time. Mental status is at baseline.      Sensory: No sensory deficit.     Motor: No weakness.  Psychiatric:        Mood and Affect: Mood normal.     (all labs ordered are listed, but only abnormal results are displayed) Labs Reviewed  BASIC METABOLIC PANEL WITH GFR - Abnormal; Notable for the following components:      Result Value   Glucose, Bld 110 (*)    All other components within normal limits  CBC - Abnormal; Notable for the following components:   WBC 11.2 (*)    All other components within normal limits  RESP PANEL BY RT-PCR (RSV, FLU A&B, COVID)  [id number]  D-DIMER, QUANTITATIVE  TROPONIN I (HIGH SENSITIVITY)  TROPONIN I (HIGH SENSITIVITY)    EKG: None  Radiology: DG Chest 2 View Result Date: 11/20/2023 EXAM: 2 VIEW(S) XRAY OF THE CHEST 11/20/2023 6:57 am COMPARISON: 10/20/2023 CLINICAL HISTORY: Chest pain. FINDINGS: LUNGS AND PLEURA: No focal pulmonary opacity. No pleural effusion. No pneumothorax. HEART AND MEDIASTINUM: No acute abnormality of the cardiac and mediastinal silhouettes. BONES AND SOFT TISSUES: No acute osseous abnormality. IMPRESSION: 1. No acute cardiopulmonary abnormality. Electronically signed by: Waddell Calk MD 11/20/2023 08:18 AM EST RP Workstation: [id number]    Procedures   Medications Ordered in the ED - No data to display                              Medical Decision Making Amount and/or Complexity of Data Reviewed Labs: ordered. Radiology: ordered.   This patient is a 43 year old male who presents to the ED for concern of progressive worse chest pain, shortness of breath described as pleuritic and accompanied with chest wall tenderness that started after trip from [place]  before he underwent right knee surgery.  Surgery went well.  Has scheduled appointment follow-up tomorrow.  Endorsing cough and congestion.  On physical exam, patient is in no acute distress, afebrile, alert and orient x 4, speaking in full sentences, nontachypneic, nontachycardic.  No he does have some  right-sided chest wall tenderness to palpation without any signs of bruising.  Noted to have well-healing surgical scar to right knee.  Unremarkable exam otherwise.  LCTAB, RRR, no murmur.  With patient's overall well-appearing presentation, low suspicion for PE however will D-dimer to evaluate.  Lab work and imaging otherwise are unremarkable without any acute findings.    D-dimer was negative.  Low suspicion for PE, pneumonia, ACS, AAS.  Will have him continue to follow-up with PCP and orthopedics.  Provided strict return ER precautions and told him to return for any new or worsening symptoms.  Follow-up with orthopedics tomorrow.  Patient vital signs have remained stable throughout the course of patient's time in the ED. Low suspicion for any other emergent pathology at this time. I believe this patient is safe to be discharged. Provided strict return to ER precautions. Patient expressed agreement and understanding of plan. All questions were answered.  Differential diagnoses prior to evaluation: The emergent differential diagnosis includes, but is not limited to, ACS, AAS, Pulmonary Embolism, Tension Pneumothorax, Esophageal Rupture, Cardiac Tamponade, Pericarditis, Myocarditis, Pneumothorax, Pneumonia, Aortic Stenosis, CHF Exacerbation, GERD,  Esophageal Spasm,  Mallory-Weiss, Costochondritis, Musculoskeletal Chest Wall Pain, Anxiety / Panic Attack. This is not an exhaustive differential.   Past Medical History / Co-morbidities / Social History: HTN, HLD  Additional history: Chart reviewed. Pertinent results include:   Last seen in the Emergency Department 10/20/2023 for uncontrolled hypertension seeking to be cleared for surgery for knee surgery.  Lab Tests/Imaging studies: I personally interpreted labs/imaging and the pertinent results include:    CBC notes a mildly elevated white count 11.2 but otherwise unremarkable BMP unremarkable Troponin with delta also unremarkable Chest  x-ray unremarkable. D-dimer unremarkable  I agree with the radiologist interpretation.  Cardiac monitoring: EKG obtained and interpreted by myself and attending physician which shows: Normal sinus rhythm with sinus arrhythmia.   Medications:   I have reviewed the patients home medicines and have made adjustments as needed.  Critical Interventions: None  Social Determinants of Health: Has good follow-up with orthopedics tomorrow.  Disposition: After consideration of the diagnostic results and the patients response to treatment, I feel that the patient would benefit from discharge intern as above.   emergency department workup does not suggest an emergent condition requiring admission or immediate intervention beyond what has been performed at this time. The plan is: Follow-up with PCP, follow-up with orthopedics, symptomatic management at home. The patient is safe for discharge and has been instructed to return immediately for worsening symptoms, change in symptoms or any other concerns.  Final diagnoses:  Chest wall pain following surgery  Respiratory tract congestion with cough    ED Discharge Orders     None          Beola Terrall RAMAN, PA-C 11/20/23 1335    Emil Share, DO 11/20/23 1336
